# Patient Record
Sex: Male | Born: 1985 | Race: White | Hispanic: No | Marital: Married | Smoking: Never smoker
Health system: Southern US, Community
[De-identification: ages and names within clinical notes are randomized; demographics above are authoritative.]

## PROBLEM LIST (undated history)

## (undated) DIAGNOSIS — F988 Other specified behavioral and emotional disorders with onset usually occurring in childhood and adolescence: Secondary | ICD-10-CM

## (undated) DIAGNOSIS — G039 Meningitis, unspecified: Secondary | ICD-10-CM

## (undated) DIAGNOSIS — Z22322 Carrier or suspected carrier of Methicillin resistant Staphylococcus aureus: Secondary | ICD-10-CM

## (undated) DIAGNOSIS — K219 Gastro-esophageal reflux disease without esophagitis: Secondary | ICD-10-CM

## (undated) HISTORY — DX: Carrier or suspected carrier of methicillin resistant Staphylococcus aureus: Z22.322

## (undated) HISTORY — DX: Gastro-esophageal reflux disease without esophagitis: K21.9

## (undated) HISTORY — DX: Meningitis, unspecified: G03.9

## (undated) HISTORY — DX: Other specified behavioral and emotional disorders with onset usually occurring in childhood and adolescence: F98.8

---
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
## 2007-07-30 ENCOUNTER — Ambulatory Visit: Payer: Self-pay | Admitting: Internal Medicine

## 2007-07-30 DIAGNOSIS — R030 Elevated blood-pressure reading, without diagnosis of hypertension: Secondary | ICD-10-CM | POA: Insufficient documentation

## 2008-04-20 ENCOUNTER — Telehealth: Payer: Self-pay | Admitting: Family Medicine

## 2008-04-21 ENCOUNTER — Ambulatory Visit: Payer: Self-pay | Admitting: Internal Medicine

## 2008-04-21 DIAGNOSIS — K5900 Constipation, unspecified: Secondary | ICD-10-CM | POA: Insufficient documentation

## 2008-04-21 DIAGNOSIS — K921 Melena: Secondary | ICD-10-CM

## 2008-04-21 DIAGNOSIS — K602 Anal fissure, unspecified: Secondary | ICD-10-CM | POA: Insufficient documentation

## 2008-10-14 ENCOUNTER — Ambulatory Visit: Payer: Self-pay | Admitting: Internal Medicine

## 2008-11-18 ENCOUNTER — Ambulatory Visit: Payer: Self-pay | Admitting: Internal Medicine

## 2008-11-19 ENCOUNTER — Encounter: Payer: Self-pay | Admitting: Internal Medicine

## 2008-11-19 ENCOUNTER — Ambulatory Visit: Payer: Self-pay | Admitting: Internal Medicine

## 2008-11-21 ENCOUNTER — Encounter: Payer: Self-pay | Admitting: Internal Medicine

## 2008-12-02 ENCOUNTER — Encounter (INDEPENDENT_AMBULATORY_CARE_PROVIDER_SITE_OTHER): Payer: Self-pay | Admitting: *Deleted

## 2009-11-30 ENCOUNTER — Ambulatory Visit: Payer: Self-pay | Admitting: Internal Medicine

## 2009-11-30 DIAGNOSIS — J069 Acute upper respiratory infection, unspecified: Secondary | ICD-10-CM | POA: Insufficient documentation

## 2009-11-30 DIAGNOSIS — M545 Low back pain: Secondary | ICD-10-CM | POA: Insufficient documentation

## 2010-03-09 NOTE — Assessment & Plan Note (Signed)
Summary: CONGESTION/NWS   Vital Signs:  Patient profile:   25 year old male Height:      71 inches Weight:      206 pounds BMI:     28.84 Temp:     97.8 degrees F oral Pulse rate:   76 / minute Pulse rhythm:   regular Resp:     16 per minute BP sitting:   130 / 70  (left arm) Cuff size:   regular  Vitals Entered By: Lanier Prude, Beverly Gust) (November 30, 2009 2:48 PM) CC: core throat X 2 days, chest pain, back pain, dizziness X 1-2 weeks Is Patient Diabetic? No Comments his daughter was recently dx with the croup   Primary Care Provider:  Dr. Posey Rea  CC:  core throat X 2 days, chest pain, back pain, and dizziness X 1-2 weeks.  History of Present Illness: The patient presents with complaints of sore throat, fever, cough, sinus congestion and drainge of several days duration. Not better with OTC meds. Chest hurts with coughing. Can't sleep due to cough. Muscle aches are present.  The mucus is colored. C/o LBP  Current Medications (verified): 1)  Protonix 40 Mg Tbec (Pantoprazole Sodium) .Marland Kitchen.. 1 By Mouth Bid 2)  Ranitidine Hcl 300 Mg Tabs (Ranitidine Hcl) .Marland Kitchen.. 1 By Mouth Qhs  Allergies (verified): 1)  ! Clonidine Hcl (Clonidine Hcl) 2)  ! Dexedrine (Dextroamphetamine Sulfate)  Past History:  Past Medical History: MRSA cellulitis hx ADD GERD Remote h/o meningitis (?)  Review of Systems       The patient complains of fever and prolonged cough.    Physical Exam  General:  Well-developed,well-nourished,in no acute distress; alert,appropriate and cooperative throughout examination Ears:  Fluid behind B ear drums Mouth:  Erythematous throat and intranasal mucosa c/w URI  Neck:  supple and no masses.   Lungs:  normal respiratory effort and normal breath sounds.   Heart:  normal rate and regular rhythm.   Msk:  Lumbar-sacral spine is tender to palpation over paraspinal muscles and painfull with the ROM Str leg elev neg B Extremities:  No clubbing, cyanosis, edema, or  deformity noted with normal full range of motion of all joints.   Psych:  Cognition and judgment appear intact. Alert and cooperative with normal attention span and concentration. No apparent delusions, illusions, hallucinations   Impression & Recommendations:  Problem # 1:  UPPER RESPIRATORY INFECTION, ACUTE (ICD-465.9) Assessment New Zpac To work 10/25 His updated medication list for this problem includes:    Promethazine-codeine 6.25-10 Mg/52ml Syrp (Promethazine-codeine) .Marland Kitchen... 5-10 ml by mouth q id as needed cough  Problem # 2:  LOW BACK PAIN, ACUTE (ICD-724.2) Assessment: New Advil as needed OV if not better  Complete Medication List: 1)  Protonix 40 Mg Tbec (Pantoprazole sodium) .Marland Kitchen.. 1 by mouth bid 2)  Ranitidine Hcl 300 Mg Tabs (Ranitidine hcl) .Marland Kitchen.. 1 by mouth qhs 3)  Zithromax Z-pak 250 Mg Tabs (Azithromycin) .... As dirrected 4)  Promethazine-codeine 6.25-10 Mg/71ml Syrp (Promethazine-codeine) .... 5-10 ml by mouth q id as needed cough  Patient Instructions: 1)  Use over-the-counter medicines for "cold": Tylenol  650mg  or Advil 400mg  every 6 hours  for fever; Delsym or Robutussin for cough. Mucinex or Mucinex D for congestion. Ricola or Halls for sore throat. Office visit if not better or if worse.  Prescriptions: PROMETHAZINE-CODEINE 6.25-10 MG/5ML SYRP (PROMETHAZINE-CODEINE) 5-10 ml by mouth q id as needed cough  #300 ml x 0   Entered and Authorized by:  Tresa Garter MD   Signed by:   Tresa Garter MD on 11/30/2009   Method used:   Print then Give to Patient   RxID:   [id number] ZITHROMAX Z-PAK 250 MG TABS (AZITHROMYCIN) as dirrected  #1 x 0   Entered and Authorized by:   Tresa Garter MD   Signed by:   Tresa Garter MD on 11/30/2009   Method used:   Electronically to        [facility] [address] [phone]* (retail)       [address] [place], [place]  [address]       Ph: [phone]       Fax: [phone]   RxID:    [id number]    Orders Added: 1)  Est. Patient Level III [63875]

## 2010-11-19 ENCOUNTER — Ambulatory Visit (INDEPENDENT_AMBULATORY_CARE_PROVIDER_SITE_OTHER): Payer: Self-pay | Admitting: Internal Medicine

## 2010-11-19 ENCOUNTER — Encounter: Payer: Self-pay | Admitting: Internal Medicine

## 2010-11-19 VITALS — BP 98/60 | HR 68 | Temp 97.7°F | Resp 16 | Ht 74.0 in | Wt 194.0 lb

## 2010-11-19 DIAGNOSIS — Z0000 Encounter for general adult medical examination without abnormal findings: Secondary | ICD-10-CM

## 2010-11-19 DIAGNOSIS — K219 Gastro-esophageal reflux disease without esophagitis: Secondary | ICD-10-CM

## 2010-11-19 NOTE — Assessment & Plan Note (Signed)
Much better 

## 2010-11-19 NOTE — Assessment & Plan Note (Signed)
We discussed age appropriate health related issues, including available/recomended screening tests and vaccinations. We discussed a need for adhering to healthy diet and exercise. Labs/EKG were adviced. All questions were answered. Form for [employer] was filled out

## 2010-11-19 NOTE — Progress Notes (Signed)
  Subjective:    Patient ID: Scott Andrade, male    DOB: 1985/02/12, 25 y.o.   MRN: [id number]  HPI The patient is here for a wellness exam. The patient has been doing well overall without major physical or psychological issues going on lately He appears well, in no apparent distress.  Alert and oriented times three, pleasant and cooperative. Vital signs are as documented in vital signs section.   Review of Systems  Constitutional: Negative for appetite change, fatigue and unexpected weight change.  HENT: Negative for nosebleeds, congestion, sore throat, sneezing, trouble swallowing and neck pain.   Eyes: Negative for itching and visual disturbance.  Respiratory: Negative for cough.   Cardiovascular: Negative for chest pain, palpitations and leg swelling.  Gastrointestinal: Negative for nausea, diarrhea, blood in stool and abdominal distention.  Genitourinary: Negative for frequency and hematuria.  Musculoskeletal: Negative for back pain, joint swelling and gait problem.  Skin: Negative for rash.  Neurological: Negative for dizziness, tremors, speech difficulty and weakness.  Psychiatric/Behavioral: Negative for sleep disturbance, dysphoric mood and agitation. The patient is not nervous/anxious.        Objective:   Physical Exam  Constitutional: He is oriented to person, place, and time. He appears well-developed.  HENT:  Mouth/Throat: Oropharynx is clear and moist.  Eyes: Conjunctivae are normal. Pupils are equal, round, and reactive to light.  Neck: Normal range of motion. No JVD present. No thyromegaly present.  Cardiovascular: Normal rate, regular rhythm, normal heart sounds and intact distal pulses.  Exam reveals no gallop and no friction rub.   No murmur heard. Pulmonary/Chest: Effort normal and breath sounds normal. No respiratory distress. He has no wheezes. He has no rales. He exhibits no tenderness.  Abdominal: Soft. Bowel sounds are normal. He exhibits no distension and no  mass. There is no tenderness. There is no rebound and no guarding.  Musculoskeletal: Normal range of motion. He exhibits no edema and no tenderness.  Lymphadenopathy:    He has no cervical adenopathy.  Neurological: He is alert and oriented to person, place, and time. He has normal reflexes. No cranial nerve deficit. He exhibits normal muscle tone. Coordination normal.  Skin: Skin is warm and dry. No rash noted.  Psychiatric: He has a normal mood and affect. His behavior is normal. Judgment and thought content normal.          Assessment & Plan:

## 2011-01-14 ENCOUNTER — Other Ambulatory Visit: Payer: Self-pay | Admitting: Occupational Medicine

## 2011-01-14 ENCOUNTER — Ambulatory Visit
Admission: RE | Admit: 2011-01-14 | Discharge: 2011-01-14 | Disposition: A | Discharge status: Home / Self Care | Payer: No Typology Code available for payment source | Source: Ambulatory Visit | Attending: Occupational Medicine | Admitting: Occupational Medicine

## 2011-01-14 DIAGNOSIS — Z021 Encounter for pre-employment examination: Secondary | ICD-10-CM

## 2011-08-05 ENCOUNTER — Emergency Department (HOSPITAL_COMMUNITY)
Admission: EM | Admit: 2011-08-05 | Discharge: 2011-08-05 | Disposition: A | Discharge status: Home / Self Care | Payer: 59 | Source: Home / Self Care | Attending: Emergency Medicine | Admitting: Emergency Medicine

## 2011-08-05 ENCOUNTER — Encounter (HOSPITAL_COMMUNITY): Payer: Self-pay

## 2011-08-05 ENCOUNTER — Emergency Department (INDEPENDENT_AMBULATORY_CARE_PROVIDER_SITE_OTHER): Payer: 59

## 2011-08-05 DIAGNOSIS — T148XXA Other injury of unspecified body region, initial encounter: Secondary | ICD-10-CM

## 2011-08-05 NOTE — ED Notes (Signed)
Pt. Refused to have an ace wrap applied. Stated that when he got home he would take it off anyway's.
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
## 2011-08-05 NOTE — Discharge Instructions (Signed)

## 2011-10-14 ENCOUNTER — Encounter: Payer: Self-pay | Admitting: Internal Medicine

## 2011-10-14 ENCOUNTER — Ambulatory Visit (INDEPENDENT_AMBULATORY_CARE_PROVIDER_SITE_OTHER): Payer: 59 | Admitting: Internal Medicine

## 2011-10-14 VITALS — BP 150/78 | HR 54 | Temp 97.6°F | Resp 16 | Wt 202.2 lb

## 2011-10-14 DIAGNOSIS — F988 Other specified behavioral and emotional disorders with onset usually occurring in childhood and adolescence: Secondary | ICD-10-CM

## 2011-10-14 DIAGNOSIS — R030 Elevated blood-pressure reading, without diagnosis of hypertension: Secondary | ICD-10-CM

## 2011-10-14 DIAGNOSIS — Z0000 Encounter for general adult medical examination without abnormal findings: Secondary | ICD-10-CM

## 2011-10-14 DIAGNOSIS — R21 Rash and other nonspecific skin eruption: Secondary | ICD-10-CM | POA: Insufficient documentation

## 2011-10-14 MED ORDER — METHYLPHENIDATE HCL ER (OSM) 18 MG PO TBCR: 18.0000 mg | EXTENDED_RELEASE_TABLET | ORAL | Status: DC

## 2011-10-14 MED ORDER — CLOTRIMAZOLE-BETAMETHASONE 1-0.05 % EX CREA: TOPICAL_CREAM | CUTANEOUS | Status: AC

## 2011-10-14 NOTE — Assessment & Plan Note (Signed)
Distal right foot, eczematous appearing but cant r/o fungal - - for lotrisone prn,  to f/u any worsening symptoms or concerns

## 2011-10-14 NOTE — Assessment & Plan Note (Signed)
Likely reactive situational, ok to continue to monitor at home and next visit

## 2011-10-14 NOTE — Progress Notes (Signed)
Subjective:    Patient ID: Scott Andrade, male    DOB: 12/27/1985, 26 y.o.   MRN: [id number]  HPI  Here to f/u;  C/o over 4 wks distal right foot rash, itchy, persistent, not really worse but not better with lotrimin as well.  No recent trauma, fever, red streaks or worsening pain.  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  Denies worsening depressive symptoms, suicidal ideation, or panic.  Pt denies chest pain, increased sob or doe, wheezing, orthopnea, PND, increased LE swelling, palpitations, dizziness or syncope.  Pt denies new neurological symptoms such as new headache, or facial or extremity weakness or numbness   Pt denies polydipsia, polyuria.  Denies worsening reflux, dysphagia, abd pain, n/v, bowel change or blood - only take the PPI every other day or so.   Has hx of elev BP at times of stress,  Usually BP at home and elsewhere < 140/90 Past Medical History  Diagnosis Date  . MRSA (methicillin resistant staph aureus) culture positive   . ADD (attention deficit disorder)   . GERD (gastroesophageal reflux disease)   . Meningitis     remote h/o   No past surgical history on file.  reports that he has never smoked. He does not have any smokeless tobacco history on file. He reports that he does not drink alcohol. His drug history not on file. family history includes GER disease in his other and Hypertension in his father. Allergies  Allergen Reactions  . Clonidine Hydrochloride   . Dextroamphetamine Sulfate    Current Outpatient Prescriptions on File Prior to Visit  Medication Sig Dispense Refill  . pantoprazole (PROTONIX) 40 MG tablet Take 40 mg by mouth 2 (two) times daily.         . methylphenidate (CONCERTA) 18 MG CR tablet Take 1 tablet (18 mg total) by mouth every morning. To fill Dec 13, 2011  30 tablet  0   Review of Systems Constitutional: Negative for diaphoresis and unexpected weight change.  HENT: Negative for drooling and tinnitus.   Eyes: Negative for photophobia and visual disturbance.  Respiratory: Negative for choking and stridor.   Gastrointestinal: Negative for vomiting and blood in stool.  Genitourinary: Negative for hematuria and decreased urine volume.  Musculoskeletal: Negative for gait problem.  Skin: Negative for color change and wound.  Neurological: Negative for tremors and numbness.     Objective:   Physical Exam BP 150/78  Pulse 54  Temp 97.6 F (36.4 C) (Oral)  Resp 16  Wt 202 lb 4 oz (91.74 kg)  SpO2 96% Physical Exam  VS noted, not ill appearing Constitutional: Pt appears well-developed and well-nourished.  HENT: Head: Normocephalic.  Right Ear: External ear normal.  Left Ear: External ear normal.  Eyes: Conjunctivae and EOM are normal. Pupils are equal, round, and reactive to light.  Neck: Normal range of motion. Neck supple.  Cardiovascular: Normal rate and regular rhythm.   Pulmonary/Chest: Effort normal and breath sounds normal.  Neurological: Pt is alert. No cranial nerve deficit.  Motor/dtr/gait intact  Skin: Skin is warm. No erythema. right distal foot with first 4 toes and dorsal distal foot with excematous type rash, scaly  erythema, NT, no red streaks swelling or drainage Psychiatric: Pt behavior is normal. Thought content normal.     Assessment & Plan:

## 2011-10-14 NOTE — Assessment & Plan Note (Signed)
Ok for generic low dose concerta, hopefully adequate without feeling hyperstimulated or loses wt,  to f/u any worsening symptoms or concerns at next visit

## 2011-10-14 NOTE — Patient Instructions (Addendum)
Take all new medications as prescribed Continue all other medications as before Please return in 3 mo with Lab testing done 3-5 days before  

## 2012-01-13 ENCOUNTER — Ambulatory Visit: Payer: 59 | Admitting: Internal Medicine

## 2012-01-13 DIAGNOSIS — Z0289 Encounter for other administrative examinations: Secondary | ICD-10-CM

## 2012-02-22 ENCOUNTER — Other Ambulatory Visit (INDEPENDENT_AMBULATORY_CARE_PROVIDER_SITE_OTHER): Payer: 59

## 2012-02-22 ENCOUNTER — Ambulatory Visit (INDEPENDENT_AMBULATORY_CARE_PROVIDER_SITE_OTHER): Payer: 59 | Admitting: Internal Medicine

## 2012-02-22 ENCOUNTER — Encounter: Payer: Self-pay | Admitting: Internal Medicine

## 2012-02-22 VITALS — BP 112/70 | HR 68 | Temp 97.9°F | Ht 73.0 in | Wt 210.5 lb

## 2012-02-22 DIAGNOSIS — Z0000 Encounter for general adult medical examination without abnormal findings: Secondary | ICD-10-CM

## 2012-02-22 DIAGNOSIS — F988 Other specified behavioral and emotional disorders with onset usually occurring in childhood and adolescence: Secondary | ICD-10-CM

## 2012-02-22 LAB — URINALYSIS, ROUTINE W REFLEX MICROSCOPIC
Bilirubin Urine: NEGATIVE
Hgb urine dipstick: NEGATIVE
Ketones, ur: NEGATIVE
Leukocytes, UA: NEGATIVE
Nitrite: NEGATIVE
Specific Gravity, Urine: >=1.03 (ref 1.000–1.030)
Total Protein, Urine: NEGATIVE
Urine Glucose: NEGATIVE
Urobilinogen, UA: 0.2 (ref 0.0–1.0)
pH: 5 (ref 5.0–8.0)

## 2012-02-22 LAB — BASIC METABOLIC PANEL
CO2: 28 mEq/L (ref 19–32)
Chloride: 106 mEq/L (ref 96–112)
Sodium: 140 mEq/L (ref 135–145)

## 2012-02-22 LAB — CBC WITH DIFFERENTIAL/PLATELET
Basophils Relative: 0.4 % (ref 0.0–3.0)
Eosinophils Absolute: 0.1 10*3/uL (ref 0.0–0.7)
Eosinophils Relative: 2 % (ref 0.0–5.0)
HCT: 43.4 % (ref 39.0–52.0)
Lymphocytes Relative: 39.1 % (ref 12.0–46.0)
Lymphs Abs: 2.5 10*3/uL (ref 0.7–4.0)
MCHC: 34.4 g/dL (ref 30.0–36.0)
MCV: 90.5 fl (ref 78.0–100.0)
Monocytes Absolute: 0.6 10*3/uL (ref 0.1–1.0)
Neutro Abs: 3.1 10*3/uL (ref 1.4–7.7)
Neutrophils Relative %: 49 % (ref 43.0–77.0)
Platelets: 181 10*3/uL (ref 150.0–400.0)
RBC: 4.8 Mil/uL (ref 4.22–5.81)
RDW: 12.9 % (ref 11.5–14.6)
WBC: 6.4 10*3/uL (ref 4.5–10.5)

## 2012-02-22 LAB — HEPATIC FUNCTION PANEL
ALT: 18 U/L (ref 0–53)
Albumin: 4.5 g/dL (ref 3.5–5.2)
Bilirubin, Direct: 0.1 mg/dL (ref 0.0–0.3)
Total Protein: 7.3 g/dL (ref 6.0–8.3)

## 2012-02-22 LAB — TSH: TSH: 2.99 u[IU]/mL (ref 0.35–5.50)

## 2012-02-22 LAB — LIPID PANEL
HDL: 34.8 mg/dL — ABNORMAL LOW (ref >39.00)
Total CHOL/HDL Ratio: 3

## 2012-02-22 MED ORDER — METHYLPHENIDATE HCL ER (OSM) 36 MG PO TBCR: 36.0000 mg | EXTENDED_RELEASE_TABLET | ORAL | Status: DC

## 2012-02-22 MED ORDER — PANTOPRAZOLE SODIUM 40 MG PO TBEC: 40.0000 mg | DELAYED_RELEASE_TABLET | Freq: Two times a day (BID) | ORAL | Status: DC

## 2012-02-22 NOTE — Patient Instructions (Addendum)
OK to stop the concerta 18 mg OK to start the concerta 36 mg per day; you have the 3 mo supply Let us know if you need or want to change to the Vyvanse, but it likely would be more expensive Please continue all other medications as before, and refills have been done if requested. Please go to the LAB in the Basement (turn left off the elevator) for the tests to be done today You will be contacted by phone if any changes need to be made immediately.  Otherwise, you will receive a letter about your results with an explanation, but please check with MyChart first. Thank you for enrolling in MyChart. Please follow the instructions below to securely access your online medical record. MyChart allows you to send messages to your doctor, view your test results, renew your prescriptions, schedule appointments, and more. To Log into My Chart online, please go by [employer] or [employer] to [email or web address], or download the MyChart App from the [employer] of [employer] .  Your Username is:  [email or web address] (pass [id number]) Please send a practice email on mychart later today Please return in 1 year for your yearly visit, or sooner if needed

## 2012-02-22 NOTE — Assessment & Plan Note (Signed)
Ok for trial increase concerta to 36 mg, consider higher strength, or change to vyvanse 40, or adderal ER bid if does not work well

## 2012-02-22 NOTE — Progress Notes (Signed)
Subjective:    Patient ID: Scott Andrade, male    DOB: 1985/04/12, 27 y.o.   MRN: [id number]  HPI  Here for wellness and f/u;  Overall doing ok;  Pt denies CP, worsening SOB, DOE, wheezing, orthopnea, PND, worsening LE edema, palpitations, dizziness or syncope.  Pt denies neurological change such as new headache, facial or extremity weakness.  Pt denies polydipsia, polyuria, or low sugar symptoms. Pt states overall good compliance with treatment and medications, good tolerability, and has been trying to follow lower cholesterol diet.  Pt denies worsening depressive symptoms, suicidal ideation or panic. No fever, night sweats, wt loss, loss of appetite, or other constitutional symptoms.  Pt states good ability with ADL's, has low fall risk, home safety reviewed and adequate, no other significant changes in hearing or vision, and only occasionally active with exercise.  Working as Armed forces technical officer,  Works 12 hr shifts , concerta seems to wear off about mid shift, less able to concentrate and perform well.   Past Medical History  Diagnosis Date  . MRSA (methicillin resistant staph aureus) culture positive   . ADD (attention deficit disorder)   . GERD (gastroesophageal reflux disease)   . Meningitis     remote h/o   No past surgical history on file.  reports that he has never smoked. He does not have any smokeless tobacco history on file. He reports that he does not drink alcohol. His drug history not on file. family history includes GER disease in his other and Hypertension in his father. Allergies  Allergen Reactions  . Clonidine Hydrochloride   . Dextroamphetamine Sulfate    Current Outpatient Prescriptions on File Prior to Visit  Medication Sig Dispense Refill  . clotrimazole-betamethasone (LOTRISONE) cream Use as directed bid as needed  45 g  1  . pantoprazole (PROTONIX) 40 MG tablet Take 1 tablet (40 mg total) by mouth 2 (two) times daily.  90 tablet  3  . methylphenidate (CONCERTA) 36  MG CR tablet Take 1 tablet (36 mg total) by mouth every morning. To fill Apr 21, 2012  30 tablet  0   Review of Systems Constitutional: Negative for diaphoresis, activity change, appetite change or unexpected weight change.  HENT: Negative for hearing loss, ear pain, facial swelling, mouth sores and neck stiffness.   Eyes: Negative for pain, redness and visual disturbance.  Respiratory: Negative for shortness of breath and wheezing.   Cardiovascular: Negative for chest pain and palpitations.  Gastrointestinal: Negative for diarrhea, blood in stool, abdominal distention or other pain Genitourinary: Negative for hematuria, flank pain or change in urine volume.  Musculoskeletal: Negative for myalgias and joint swelling.  Skin: Negative for color change and wound.  Neurological: Negative for syncope and numbness. other than noted Hematological: Negative for adenopathy.  Psychiatric/Behavioral: Negative for hallucinations, self-injury, and agitation.      Objective:   Physical Exam BP 112/70  Pulse 68  Temp 97.9 F (36.6 C) (Oral)  Ht 6\' 1"  (1.854 m)  Wt 210 lb 8 oz (95.482 kg)  BMI 27.77 kg/m2  SpO2 97% VS noted,  Constitutional: Pt is oriented to person, place, and time. Appears well-developed and well-nourished.  Head: Normocephalic and atraumatic.  Right Ear: External ear normal.  Left Ear: External ear normal.  Nose: Nose normal.  Mouth/Throat: Oropharynx is clear and moist.  Eyes: Conjunctivae and EOM are normal. Pupils are equal, round, and reactive to light.  Neck: Normal range of motion. Neck supple. No JVD present. No tracheal  deviation present.  Cardiovascular: Normal rate, regular rhythm, normal heart sounds and intact distal pulses.   Pulmonary/Chest: Effort normal and breath sounds normal.  Abdominal: Soft. Bowel sounds are normal. There is no tenderness. No HSM  Musculoskeletal: Normal range of motion. Exhibits no edema.  Lymphadenopathy:  Has no cervical adenopathy.   Neurological: Pt is alert and oriented to person, place, and time. Pt has normal reflexes. No cranial nerve deficit.  Skin: Skin is warm and dry. No rash noted.  Psychiatric:  Has  normal mood and affect. Behavior is normal.     Assessment & Plan:

## 2012-02-22 NOTE — Assessment & Plan Note (Signed)

## 2012-03-24 ENCOUNTER — Other Ambulatory Visit: Payer: Self-pay

## 2012-12-13 ENCOUNTER — Other Ambulatory Visit: Payer: Self-pay

## 2013-06-21 ENCOUNTER — Other Ambulatory Visit: Payer: Self-pay | Admitting: Internal Medicine

## 2013-07-18 ENCOUNTER — Other Ambulatory Visit: Payer: Self-pay | Admitting: Internal Medicine

## 2013-10-10 ENCOUNTER — Encounter: Payer: Self-pay | Admitting: Internal Medicine

## 2018-07-24 ENCOUNTER — Telehealth: Payer: Self-pay | Admitting: Cardiovascular Disease

## 2018-07-24 NOTE — Telephone Encounter (Signed)
Did not need this encounter °

## 2018-11-11 ENCOUNTER — Other Ambulatory Visit: Payer: Self-pay

## 2018-11-11 ENCOUNTER — Emergency Department (HOSPITAL_COMMUNITY)
Admission: EM | Admit: 2018-11-11 | Discharge: 2018-11-11 | Discharge status: Absent without leave | Payer: 59 | Attending: Emergency Medicine | Admitting: Emergency Medicine

## 2018-11-11 ENCOUNTER — Encounter (HOSPITAL_COMMUNITY): Payer: Self-pay

## 2019-10-25 ENCOUNTER — Emergency Department (HOSPITAL_BASED_OUTPATIENT_CLINIC_OR_DEPARTMENT_OTHER): Payer: 59

## 2019-10-25 ENCOUNTER — Encounter (HOSPITAL_BASED_OUTPATIENT_CLINIC_OR_DEPARTMENT_OTHER): Payer: Self-pay | Admitting: Emergency Medicine

## 2019-10-25 ENCOUNTER — Emergency Department (HOSPITAL_BASED_OUTPATIENT_CLINIC_OR_DEPARTMENT_OTHER)
Admission: EM | Admit: 2019-10-25 | Discharge: 2019-10-25 | Disposition: A | Discharge status: Home / Self Care | Payer: No Typology Code available for payment source | Attending: Emergency Medicine | Admitting: Emergency Medicine

## 2019-10-25 ENCOUNTER — Other Ambulatory Visit: Payer: Self-pay

## 2019-10-25 DIAGNOSIS — S60222A Contusion of left hand, initial encounter: Secondary | ICD-10-CM | POA: Diagnosis not present

## 2019-10-25 DIAGNOSIS — M26629 Arthralgia of temporomandibular joint, unspecified side: Secondary | ICD-10-CM | POA: Diagnosis not present

## 2019-10-25 DIAGNOSIS — S50311A Abrasion of right elbow, initial encounter: Secondary | ICD-10-CM | POA: Diagnosis not present

## 2019-10-25 DIAGNOSIS — T07XXXA Unspecified multiple injuries, initial encounter: Secondary | ICD-10-CM

## 2019-10-25 DIAGNOSIS — S63611A Unspecified sprain of left index finger, initial encounter: Secondary | ICD-10-CM | POA: Diagnosis not present

## 2019-10-25 DIAGNOSIS — S63691A Other sprain of left index finger, initial encounter: Secondary | ICD-10-CM

## 2019-10-25 DIAGNOSIS — S6992XA Unspecified injury of left wrist, hand and finger(s), initial encounter: Secondary | ICD-10-CM | POA: Diagnosis present
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
## 2022-07-31 ENCOUNTER — Emergency Department (HOSPITAL_BASED_OUTPATIENT_CLINIC_OR_DEPARTMENT_OTHER): Payer: 59 | Admitting: Radiology

## 2022-07-31 ENCOUNTER — Emergency Department (HOSPITAL_BASED_OUTPATIENT_CLINIC_OR_DEPARTMENT_OTHER)
Admission: EM | Admit: 2022-07-31 | Discharge: 2022-07-31 | Disposition: A | Discharge status: Home / Self Care | Payer: Worker's Compensation | Attending: Emergency Medicine | Admitting: Emergency Medicine

## 2022-07-31 ENCOUNTER — Encounter (HOSPITAL_BASED_OUTPATIENT_CLINIC_OR_DEPARTMENT_OTHER): Payer: Self-pay | Admitting: Emergency Medicine

## 2022-07-31 ENCOUNTER — Other Ambulatory Visit: Payer: Self-pay

## 2022-07-31 DIAGNOSIS — S93402A Sprain of unspecified ligament of left ankle, initial encounter: Secondary | ICD-10-CM | POA: Diagnosis not present

## 2022-07-31 DIAGNOSIS — X501XXA Overexertion from prolonged static or awkward postures, initial encounter: Secondary | ICD-10-CM | POA: Insufficient documentation

## 2022-07-31 DIAGNOSIS — Y990 Civilian activity done for income or pay: Secondary | ICD-10-CM | POA: Insufficient documentation

## 2022-07-31 DIAGNOSIS — S76302A Unspecified injury of muscle, fascia and tendon of the posterior muscle group at thigh level, left thigh, initial encounter: Secondary | ICD-10-CM | POA: Diagnosis not present

## 2022-07-31 DIAGNOSIS — S99912A Unspecified injury of left ankle, initial encounter: Secondary | ICD-10-CM | POA: Diagnosis present
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
## 2023-12-17 ENCOUNTER — Encounter: Payer: Self-pay | Admitting: Emergency Medicine

## 2023-12-17 ENCOUNTER — Ambulatory Visit
Admission: EM | Admit: 2023-12-17 | Discharge: 2023-12-17 | Disposition: A | Discharge status: Home / Self Care | Attending: Family Medicine | Admitting: Family Medicine

## 2023-12-17 DIAGNOSIS — R519 Headache, unspecified: Secondary | ICD-10-CM

## 2023-12-17 MED ORDER — KETOROLAC TROMETHAMINE 30 MG/ML IJ SOLN
30.0000 mg | Freq: Once | INTRAMUSCULAR | Status: AC
  Administered 2023-12-17: 30 mg via INTRAMUSCULAR

## 2023-12-17 MED ORDER — NAPROXEN 500 MG PO TABS
500.0000 mg | ORAL_TABLET | Freq: Two times a day (BID) | ORAL | 0 refills | Status: AC | PRN
Start: 2023-12-18 — End: ?

## 2023-12-17 MED ORDER — METHOCARBAMOL 500 MG PO TABS
500.0000 mg | ORAL_TABLET | Freq: Two times a day (BID) | ORAL | 0 refills | Status: AC | PRN
Start: 2023-12-17 — End: ?

## 2023-12-17 NOTE — Discharge Instructions (Addendum)
 You were given a Toradol injection in clinic today. Do not take any over the counter NSAID's such as Advil, ibuprofen, Aleve, or naproxen for 24 hours. you may take tylenol if needed  Trial of Robaxin twice daily as needed.  Please note this is a muscle relaxer and will make you tired.  Do not drink alcohol or drive on this medication.  May start naproxen twice daily as needed tomorrow, 11/10 as needed for headaches.  Please follow-up with your PCP in 2 days for recheck.  Please go to the ER if you develop any worsening symptoms.  I hope you feel better soon!
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
